# Patient Record
Sex: Male | Born: 1960 | Race: White | Hispanic: No | Marital: Married | Smoking: Never smoker
Health system: Southern US, Community
[De-identification: ages and names within clinical notes are randomized; demographics above are authoritative.]

---

## 1998-08-15 ENCOUNTER — Ambulatory Visit (HOSPITAL_BASED_OUTPATIENT_CLINIC_OR_DEPARTMENT_OTHER): Admission: RE | Admit: 1998-08-15 | Discharge: 1998-08-15 | Payer: Self-pay | Admitting: Orthopedic Surgery

## 1998-08-16 ENCOUNTER — Encounter: Payer: Self-pay | Admitting: Orthopedic Surgery

## 2003-12-12 ENCOUNTER — Ambulatory Visit: Payer: Self-pay | Admitting: Urology

## 2004-02-05 ENCOUNTER — Ambulatory Visit: Payer: Self-pay | Admitting: Urology

## 2004-05-31 ENCOUNTER — Ambulatory Visit: Payer: Self-pay | Admitting: *Deleted

## 2004-06-14 ENCOUNTER — Ambulatory Visit: Payer: Self-pay | Admitting: *Deleted

## 2004-06-21 ENCOUNTER — Ambulatory Visit: Payer: Self-pay | Admitting: *Deleted

## 2004-07-01 ENCOUNTER — Ambulatory Visit: Payer: Self-pay | Admitting: *Deleted

## 2004-07-08 ENCOUNTER — Ambulatory Visit: Payer: Self-pay | Admitting: *Deleted

## 2004-07-19 ENCOUNTER — Ambulatory Visit: Payer: Self-pay | Admitting: *Deleted

## 2004-07-26 ENCOUNTER — Ambulatory Visit: Payer: Self-pay | Admitting: *Deleted

## 2004-08-02 ENCOUNTER — Ambulatory Visit: Payer: Self-pay | Admitting: *Deleted

## 2004-08-09 ENCOUNTER — Ambulatory Visit: Payer: Self-pay | Admitting: *Deleted

## 2004-08-16 ENCOUNTER — Ambulatory Visit: Payer: Self-pay | Admitting: *Deleted

## 2004-08-23 ENCOUNTER — Ambulatory Visit: Payer: Self-pay | Admitting: *Deleted

## 2004-09-06 ENCOUNTER — Ambulatory Visit: Payer: Self-pay | Admitting: *Deleted

## 2016-04-02 ENCOUNTER — Encounter: Payer: Self-pay | Admitting: Student

## 2016-04-02 ENCOUNTER — Other Ambulatory Visit: Payer: Self-pay | Admitting: Student

## 2016-04-02 ENCOUNTER — Ambulatory Visit (INDEPENDENT_AMBULATORY_CARE_PROVIDER_SITE_OTHER): Payer: 59 | Admitting: Student

## 2016-04-02 DIAGNOSIS — M7071 Other bursitis of hip, right hip: Secondary | ICD-10-CM

## 2016-04-02 MED ORDER — MELOXICAM 15 MG PO TABS: 15.0000 mg | ORAL_TABLET | Freq: Every day | ORAL | 2 refills | Status: DC

## 2016-04-02 NOTE — Progress Notes (Signed)
  Johnny Anderson - 56 y.o. male MRN [id number]  Date of birth: 04/15/1960  SUBJECTIVE:  Including CC & ROS.  CC: right hamstring pain  Complains of right hamstring pain that has been ongoing for the past 3 weeks. He reports that over Christmas he has been sitting on a couch that had a metal bar through the middle of it and he feels that it was right underneath issue tuberosities. He is having pain aspect. He denies any acute injury to the area. He does with but it does not bother him to lift. He feels that he has full strength. He denies any radiculopathy.  It only bothers him to sit long periods.    ROS: No unexpected weight loss, fever, chills, swelling, instability, muscle pain, numbness/tingling, redness, otherwise see HPI   PMHx - Updated and reviewed.  Contributory factors include: Negative PSHx - Updated and reviewed.  Contributory factors include:  Negative FHx - Updated and reviewed.  Contributory factors include:  Negative Social Hx - Updated and reviewed. Contributory factors include: Negative Medications - reviewed   DATA REVIEWED: none  PHYSICAL EXAM:  VS: BP:(!) 148/90  HR: bpm  TEMP: ( )  RESP:   HT:6' (182.9 cm)   WT:295 lb (133.8 kg)  BMI:40.1 PHYSICAL EXAM: Gen: NAD, alert, cooperative with exam, well-appearing HEENT: clear conjunctiva,  CV:  no edema, capillary refill brisk, normal rate Resp: non-labored Skin: no rashes, normal turgor  Neuro: no gross deficits.  Psych:  alert and oriented  Hip: ROM IR: 45 Deg, ER: 45 Deg, Flexion: 75 Deg, Extension: 100 Deg, Abduction: 45 Deg, Adduction: 45 Deg Strength IR: 5/5, ER: 5/5, Flexion: 5/5, Extension: 5/5, Abduction: 5/5, Adduction: 5/5 Pelvic alignment unremarkable to inspection and palpation. Standing hip rotation and gait without trendelenburg sign / unsteadiness. Greater trochanter without tenderness to palpation. Ischial tuberosity with mild TTP on right Full hamstring strengthening for all heads of the  hamstring on right No tenderness over piriformis. No pain with FABER or FADIR. No SI joint tenderness and normal minimal SI movement.  Ultrasound: Limited ultrasound of right gluteal region was obtained. Violation of the hamstring tendon were reviewed and long and short axis which hypoechoic or hyperechoic changes. Ischial tuberosity with hypoechoic changes in the bursa. Findings consistent with ischial tuberosity bursitis.  ASSESSMENT & PLAN:   Ischial bursitis of right side Recommend anti-inflammatories, avoidance of prolonged sitting, ice.  Can continue exercises.

## 2016-04-02 NOTE — Assessment & Plan Note (Addendum)
Recommend anti-inflammatories, avoidance of prolonged sitting, ice.  Can continue exercises.  Follow up if not improved in the next 3-4 weeks. Can consider other imaging and/or exercises at that time. He is having minimal pain to doubt a stress injury to the bone. Did not see any tears on ultrasound.

## 2016-06-11 ENCOUNTER — Ambulatory Visit (INDEPENDENT_AMBULATORY_CARE_PROVIDER_SITE_OTHER): Payer: 59 | Admitting: Family Medicine

## 2016-06-11 ENCOUNTER — Encounter: Payer: Self-pay | Admitting: Family Medicine

## 2016-06-11 VITALS — BP 168/92 | Ht 72.0 in | Wt 295.0 lb

## 2016-06-11 DIAGNOSIS — M7071 Other bursitis of hip, right hip: Secondary | ICD-10-CM | POA: Diagnosis not present

## 2016-06-12 NOTE — Progress Notes (Signed)
  [facility] - 56 y.o. male MRN [id number]  Date of birth: 03-07-1960  SUBJECTIVE:  Including CC & ROS.   Mr. Johnny Anderson is a 56 year old male that is following up for right hamstring pain. There is concern that he had some is she will bursitis. He has been doing exercises and taking mobic and that seems improved his pain. He seems to have worse pain with long drives. The pain seems a last about 24 hours then it feels better. He has no pain with working out. He has been doing some nonweightbearing squats and that has improved the pain. There is no specific inciting event prior to this pain occurring.  ROS: No unexpected weight loss, fever, chills, swelling, instability, numbness/tingling, redness, otherwise see HPI    HISTORY: Past Medical, Surgical, Social, and Family History Reviewed & Updated per EMR.   Pertinent Historical Findings include: PMSHx -  Left tibial surgery  PSHx - never smoker    DATA REVIEWED: Previous ultrasound   PHYSICAL EXAM:  VS: BP:(!) 168/92  HR: bpm  TEMP: ( )  RESP:   HT:6' (182.9 cm)   WT:295 lb (133.8 kg)  BMI:40.1 PHYSICAL EXAM: Gen: NAD, alert, cooperative with exam, well-appearing HEENT: clear conjunctiva, EOMI CV:  no edema, capillary refill brisk,  Resp: non-labored, normal speech Skin: no rashes, normal turgor  Neuro: no gross deficits.  Psych:  alert and oriented Right Hip:  No tenderness to palpation over the piriformis, SI joint, lumbar spine, paraspinal muscle, or greater trochanter. No significant tenderness over the issue tuberosity. No tenderness to palpation over the mid belly of the hamstring. Normal hip flexion strength. Normal internal and external rotation of the hip. Normal knee flexion extension. Normal strength resistance with knee flexion and extension. Neurovascularly intact.   ASSESSMENT & PLAN:   Ischial bursitis of right side He seems to have improvement of his problem that is only exacerbated with long  drives. - He can start to wean off of the mobic and take it as needed - He was provided some further home exercises today. - He will follow-up as needed and can consider an injection under ultrasound guidance in the future.

## 2016-06-12 NOTE — Assessment & Plan Note (Signed)
He seems to have improvement of his problem that is only exacerbated with long drives. - He can start to wean off of the mobic and take it as needed - He was provided some further home exercises today. - He will follow-up as needed and can consider an injection under ultrasound guidance in the future.

## 2016-07-02 ENCOUNTER — Other Ambulatory Visit: Payer: Self-pay | Admitting: Student

## 2016-07-30 DIAGNOSIS — I10 Essential (primary) hypertension: Secondary | ICD-10-CM | POA: Diagnosis not present

## 2016-07-30 DIAGNOSIS — Z79899 Other long term (current) drug therapy: Secondary | ICD-10-CM | POA: Diagnosis not present

## 2016-08-11 ENCOUNTER — Ambulatory Visit
Admission: RE | Admit: 2016-08-11 | Discharge: 2016-08-11 | Disposition: A | Discharge status: Home / Self Care | Payer: 59 | Source: Ambulatory Visit | Attending: Sports Medicine | Admitting: Sports Medicine

## 2016-08-11 ENCOUNTER — Ambulatory Visit (INDEPENDENT_AMBULATORY_CARE_PROVIDER_SITE_OTHER): Payer: 59 | Admitting: Sports Medicine

## 2016-08-11 VITALS — BP 132/90 | Ht 72.0 in | Wt 295.0 lb

## 2016-08-11 DIAGNOSIS — M7071 Other bursitis of hip, right hip: Secondary | ICD-10-CM

## 2016-08-11 DIAGNOSIS — M1990 Unspecified osteoarthritis, unspecified site: Secondary | ICD-10-CM | POA: Diagnosis not present

## 2016-08-11 NOTE — Progress Notes (Signed)
   Subjective:    Patient ID: Johnny Anderson, male    DOB: 04/30/1960, 56 y.o.   MRN: [id number]  HPI chief complaint: Posterior right hip pain  Patient comes in today with persistent posterior right hip pain. He has been seen in our clinic previously and diagnosed with possible ischial tuberosity bursitis. He got initial improvement with meloxicam but it was not curative. He continues to have pain that he localizes to the ischium with prolonged sitting. Symptoms improve with standing and walking. No numbness or tingling. No groin pain. Previous ultrasound suggested inflammation in the bursa at the ischial tuberosity. No x-rays have been done.    Review of Systems As above    Objective:   Physical Exam  Well-developed, well-nourished. No acute distress  Right hip: Smooth painless hip range of motion with a negative log roll. He has mild tenderness to palpation at the ischial tuberosity. Excellent strength with resisted hamstring. No pain with this. Negative straight leg raise. Neurovascularly intact distally. Walking without a limp.  X-rays of his pelvis show some mild degenerative changes at the right SI joint and of both hips. No abnormality of the ischium.      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 Patient will return to the office in 2-3 weeks for a diagnostic/therapeutic ultrasound-guided ischial tuberosity bursal injection. In the meantime, I think he can continue with activity as tolerated. If he continues to have pain after the injection then we may need to consider merits of further diagnostic imaging.

## 2016-08-25 ENCOUNTER — Ambulatory Visit: Payer: 59 | Admitting: Sports Medicine

## 2016-09-01 ENCOUNTER — Encounter: Payer: Self-pay | Admitting: Sports Medicine

## 2016-09-01 ENCOUNTER — Ambulatory Visit (INDEPENDENT_AMBULATORY_CARE_PROVIDER_SITE_OTHER): Payer: 59 | Admitting: Sports Medicine

## 2016-09-01 VITALS — BP 127/72 | Ht 72.0 in | Wt 285.0 lb

## 2016-09-01 DIAGNOSIS — M7071 Other bursitis of hip, right hip: Secondary | ICD-10-CM

## 2016-09-01 MED ORDER — METHYLPREDNISOLONE ACETATE 40 MG/ML IJ SUSP
40.0000 mg | Freq: Once | INTRAMUSCULAR | Status: AC
  Administered 2016-09-01: 40 mg via INTRA_ARTICULAR

## 2016-09-01 NOTE — Progress Notes (Signed)
Patient ID: Johnny EasterlyJames W Anderson, male   DOB: 11/16/1960, 56 y.o.   MRN: [id number]   Patient comes in today for an ultrasound guided cortisone injection into his right ischial tuberosity bursa. Please see previous office notes for details regarding history and physical exam findings. 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 No bony abnormalities at the ischial tuberosity.  Under ultrasound guidance, the patient's right ischial tuberosity bursa was injected with cortisone. This was done after risks and benefits were explained including the risk of tendon rupture, infection, bleeding, hematoma formation, and sciatic nerve injury. Patient tolerated this procedure without difficulty. I think he would also benefit from physical therapy with Ellamae SiaJohn O'Halloran. He can discharge to a home exercise program per Dr. Val Eagle Halloran's discretion. Patient will follow-up with me as needed.  Consent obtained and verified. Time-out conducted. Noted no overlying erythema, induration, or other signs of local infection. Skin prepped in a sterile fashion. Topical analgesic spray: Ethyl chloride. Joint: right ischial tuberosity bursa Needle: 25g 1.5 inch Completed without difficulty. Meds: 3cc 1% xylocaine, 1cc (40mg ) depomedrol  Advised to call if fevers/chills, erythema, induration, drainage, or persistent bleeding.

## 2016-11-27 DIAGNOSIS — H26491 Other secondary cataract, right eye: Secondary | ICD-10-CM | POA: Diagnosis not present

## 2016-11-27 DIAGNOSIS — Z961 Presence of intraocular lens: Secondary | ICD-10-CM | POA: Diagnosis not present

## 2016-11-27 DIAGNOSIS — H2512 Age-related nuclear cataract, left eye: Secondary | ICD-10-CM | POA: Diagnosis not present

## 2016-12-03 DIAGNOSIS — H26491 Other secondary cataract, right eye: Secondary | ICD-10-CM | POA: Diagnosis not present

## 2017-03-09 DIAGNOSIS — Z0000 Encounter for general adult medical examination without abnormal findings: Secondary | ICD-10-CM | POA: Diagnosis not present

## 2017-03-09 DIAGNOSIS — Z79899 Other long term (current) drug therapy: Secondary | ICD-10-CM | POA: Diagnosis not present

## 2017-03-09 DIAGNOSIS — Z125 Encounter for screening for malignant neoplasm of prostate: Secondary | ICD-10-CM | POA: Diagnosis not present

## 2017-03-09 DIAGNOSIS — I10 Essential (primary) hypertension: Secondary | ICD-10-CM | POA: Diagnosis not present

## 2017-03-09 DIAGNOSIS — Z23 Encounter for immunization: Secondary | ICD-10-CM | POA: Diagnosis not present

## 2017-04-06 DIAGNOSIS — Z79899 Other long term (current) drug therapy: Secondary | ICD-10-CM | POA: Diagnosis not present

## 2017-06-08 DIAGNOSIS — I10 Essential (primary) hypertension: Secondary | ICD-10-CM | POA: Diagnosis not present

## 2017-09-16 DIAGNOSIS — Z79899 Other long term (current) drug therapy: Secondary | ICD-10-CM | POA: Diagnosis not present

## 2017-09-16 DIAGNOSIS — I10 Essential (primary) hypertension: Secondary | ICD-10-CM | POA: Diagnosis not present

## 2018-03-15 DIAGNOSIS — E7800 Pure hypercholesterolemia, unspecified: Secondary | ICD-10-CM | POA: Diagnosis not present

## 2018-03-15 DIAGNOSIS — Z125 Encounter for screening for malignant neoplasm of prostate: Secondary | ICD-10-CM | POA: Diagnosis not present

## 2018-03-15 DIAGNOSIS — Z23 Encounter for immunization: Secondary | ICD-10-CM | POA: Diagnosis not present

## 2018-03-15 DIAGNOSIS — I10 Essential (primary) hypertension: Secondary | ICD-10-CM | POA: Diagnosis not present

## 2018-03-15 DIAGNOSIS — Z79899 Other long term (current) drug therapy: Secondary | ICD-10-CM | POA: Diagnosis not present

## 2018-03-15 DIAGNOSIS — Z0000 Encounter for general adult medical examination without abnormal findings: Secondary | ICD-10-CM | POA: Diagnosis not present
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
## 2019-05-20 ENCOUNTER — Telehealth: Payer: Self-pay | Admitting: *Deleted

## 2019-05-20 NOTE — Telephone Encounter (Signed)
Pt has now called back to discuss [facility] "failure in this whole process". States he will go to [facility] and we can expect a call from them. Tried to help him set up MyChart so he can see his appt. He states MyChart is a Education officer, museum for [employer]. States his other doctors have portals. I tried to explain it is our portal but he does not want to hear that. He again is being rude, then apologizing, rude, then apologizing. States after speaking with me earlier he went on [facility] site and they send an email to confirm, it states don't come if you don't have an email because you will not get a vaccine. I asked him if he got an appt there so I can cancel his appt I made earlier. He wants to get his through [facility].  I explained that I have now checked three times for him and he has an appt. I also explained that maybe a computerized email is sent but that I myself, this night, due to policy, I can not send him an email to confirm. When I said I sent him a new activation code for MyChart he became more upset. States if I don't have MyChart how would I ever know that. I explained if he will set it up he would have his health information handy in one spot. He again states it is a Education officer, museum. Demanding to know who he can call. I stated that it being Friday night there will be no one to call tonight. I gave him Marcelino Duster Rothrock's name as Interior and spatial designer of [employer]. He asked for a phone number. Gave him the [phone]. Explained that she would not be in office until Monday.

## 2019-05-20 NOTE — Telephone Encounter (Signed)
Error, see two incoming phone calls from this date, disregard this one.

## 2019-05-20 NOTE — Telephone Encounter (Signed)
Pt called very upset about the system for signing up for vaccine. [facility] [place] [facility] and then it asked to go to MyChart which he did and then there was no way to return. Wants email confirmation about the appt. I looked and there was no appt. I made appt for 3/29 at 3pm. Explained that I can not email but will ask about the situation and see what I can do. Reassured him that he does have the appt I scheduled for him. Still upset, ranting about the bad system. Does not want to hear about MyChart. Reassured as best I could.

## 2019-05-21 ENCOUNTER — Ambulatory Visit: Payer: Self-pay | Attending: Internal Medicine

## 2019-05-23 ENCOUNTER — Ambulatory Visit: Payer: 59 | Attending: Internal Medicine

## 2019-05-23 DIAGNOSIS — Z23 Encounter for immunization: Secondary | ICD-10-CM

## 2019-05-23 NOTE — Progress Notes (Signed)
   Covid-19 Vaccination Clinic  Name:  Johnny Anderson    MRN: [id number] DOB: 22-Mar-1960  05/23/2019  [name] was observed post Covid-19 immunization for 15 minutes without incident. He was provided with Vaccine Information Sheet and instruction to access the V-Safe system.   [name] was instructed to call 911 with any severe reactions post vaccine: Marland Kitchen Difficulty breathing  . Swelling of face and throat  . A fast heartbeat  . A bad rash all over body  . Dizziness and weakness   Immunizations Administered    Name Date Dose VIS Date Route   Pfizer COVID-19 Vaccine 05/23/2019  1:42 PM 0.3 mL 02/04/2019 Intramuscular   Manufacturer: [employer], [employer]   Lot: [id number]   NDC: [address]-9

## 2019-06-14 ENCOUNTER — Ambulatory Visit: Payer: 59 | Attending: Internal Medicine

## 2019-06-14 DIAGNOSIS — Z23 Encounter for immunization: Secondary | ICD-10-CM

## 2019-06-14 NOTE — Progress Notes (Signed)
   Covid-19 Vaccination Clinic  Name:  Johnny Anderson    MRN: [id number] DOB: 1960-11-20  06/14/2019  [name] was observed post Covid-19 immunization for 15 minutes without incident. He was provided with Vaccine Information Sheet and instruction to access the V-Safe system.   [name] was instructed to call 911 with any severe reactions post vaccine: Marland Kitchen Difficulty breathing  . Swelling of face and throat  . A fast heartbeat  . A bad rash all over body  . Dizziness and weakness   Immunizations Administered    Name Date Dose VIS Date Route   Pfizer COVID-19 Vaccine 06/14/2019  2:02 PM 0.3 mL 04/20/2018 Intramuscular   Manufacturer: [employer], [employer]   Lot: [id number]   NDC: [address]-1

## 2019-08-09 ENCOUNTER — Other Ambulatory Visit: Payer: Self-pay | Admitting: Orthopedic Surgery

## 2019-08-09 DIAGNOSIS — M545 Low back pain, unspecified: Secondary | ICD-10-CM

## 2019-09-10 ENCOUNTER — Other Ambulatory Visit: Payer: 59

## 2020-03-15 ENCOUNTER — Other Ambulatory Visit: Payer: Self-pay

## 2020-03-15 ENCOUNTER — Ambulatory Visit (INDEPENDENT_AMBULATORY_CARE_PROVIDER_SITE_OTHER): Payer: 59 | Admitting: Allergy and Immunology

## 2020-03-15 ENCOUNTER — Encounter: Payer: Self-pay | Admitting: Allergy and Immunology

## 2020-03-15 VITALS — BP 126/82 | HR 85 | Resp 16 | Ht 72.0 in | Wt 285.6 lb

## 2020-03-15 DIAGNOSIS — J310 Chronic rhinitis: Secondary | ICD-10-CM | POA: Diagnosis not present

## 2020-03-15 DIAGNOSIS — T59891A Toxic effect of other specified gases, fumes and vapors, accidental (unintentional), initial encounter: Secondary | ICD-10-CM

## 2020-03-15 DIAGNOSIS — T50905A Adverse effect of unspecified drugs, medicaments and biological substances, initial encounter: Secondary | ICD-10-CM

## 2020-03-15 MED ORDER — MUPIROCIN 2 % EX OINT: TOPICAL_OINTMENT | CUTANEOUS | 0 refills | Status: DC

## 2020-03-15 NOTE — Patient Instructions (Addendum)
  1. Treat inflammation and irritation of nasal airway:   A. Nasal saline followed by Bactroban in nose 3 times a day for 2 weeks  B. OTC Rhinocort - 1 spray each nostril 1 time a day (No Flonase)  C. Prednisone 10 mg - 1 tablet 1 time per day for 10 days only  2.  Temporarily eliminate use of statin for at least 4 weeks  3. If doing well while having exposure and after treatment, then stay on Rhinocort at 3 times per week and do not use statin for an additional 3 months.  4. If not doing well while having exposure and after treatment, exchange HVAC system  5. Immediately eliminate ozone generator from household
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
## 2020-03-16 ENCOUNTER — Encounter: Payer: Self-pay | Admitting: Allergy and Immunology

## 2020-03-16 ENCOUNTER — Ambulatory Visit: Payer: Self-pay | Admitting: Allergy and Immunology

## 2021-04-19 ENCOUNTER — Other Ambulatory Visit: Payer: Self-pay | Admitting: Physician Assistant

## 2021-04-19 DIAGNOSIS — N50819 Testicular pain, unspecified: Secondary | ICD-10-CM

## 2021-04-23 ENCOUNTER — Ambulatory Visit
Admission: RE | Admit: 2021-04-23 | Discharge: 2021-04-23 | Disposition: A | Discharge status: Home / Self Care | Payer: 59 | Source: Ambulatory Visit | Attending: Physician Assistant | Admitting: Physician Assistant

## 2021-04-23 DIAGNOSIS — N50819 Testicular pain, unspecified: Secondary | ICD-10-CM

## 2021-12-02 ENCOUNTER — Other Ambulatory Visit: Payer: Self-pay | Admitting: Orthopedic Surgery

## 2021-12-02 DIAGNOSIS — Z01818 Encounter for other preprocedural examination: Secondary | ICD-10-CM

## 2021-12-31 ENCOUNTER — Ambulatory Visit
Admission: RE | Admit: 2021-12-31 | Discharge: 2021-12-31 | Disposition: A | Discharge status: Home / Self Care | Payer: 59 | Source: Ambulatory Visit | Attending: Orthopedic Surgery | Admitting: Orthopedic Surgery

## 2021-12-31 DIAGNOSIS — Z01818 Encounter for other preprocedural examination: Secondary | ICD-10-CM

## 2022-05-14 ENCOUNTER — Encounter: Payer: Self-pay | Admitting: Podiatry

## 2022-05-14 ENCOUNTER — Ambulatory Visit: Payer: 59 | Admitting: Podiatry

## 2022-05-14 DIAGNOSIS — Z8739 Personal history of other diseases of the musculoskeletal system and connective tissue: Secondary | ICD-10-CM

## 2022-05-14 DIAGNOSIS — M792 Neuralgia and neuritis, unspecified: Secondary | ICD-10-CM

## 2022-05-14 NOTE — Progress Notes (Signed)
Subjective:  Patient ID: Johnny Anderson, male    DOB: 07-20-1960,  MRN: [id number]  Chief Complaint  Patient presents with   Toe Pain    Left foot 3,4th toe has a burning sensation     62 y.o. male presents with the above complaint.  Patient presents with left third and fourth digit burning sensation.  Patient states that he is noticing this morning more of that he went to get it evaluated he has a significant history injury/arthritis to the knee as well as the Anderson back.  He would like to discuss treatment options if any available.  He wants to make sure that this is nothing concerning.  He has not seen anyone as prior to seeing me denies any other acute complaints.   Review of Systems: Negative except as noted in the HPI. Denies N/V/F/Ch.  History reviewed. No pertinent past medical history.  Current Outpatient Medications:    Acetaminophen Extra Strength 500 MG CAPS, Take 2 capsules by mouth every 8 (eight) hours., Disp: , Rfl:    ALPRAZolam (XANAX) 1 MG tablet, 1 tablet Orally once at night for 30 days, Disp: , Rfl:    aspirin 81 MG chewable tablet, Chew 81 mg by mouth 2 (two) times daily., Disp: , Rfl:    celecoxib (CELEBREX) 100 MG capsule, Take 100 mg by mouth 2 (two) times daily., Disp: , Rfl:    diclofenac (CATAFLAM) 50 MG tablet, Take 50 mg by mouth 2 (two) times daily., Disp: , Rfl:    diclofenac (VOLTAREN) 50 MG EC tablet, , Disp: , Rfl:    gabapentin (NEURONTIN) 100 MG capsule, Take 100 mg by mouth 3 (three) times daily., Disp: , Rfl:    ondansetron (ZOFRAN) 4 MG tablet, Take 4 mg by mouth every 6 (six) hours as needed., Disp: , Rfl:    oxyCODONE (OXY IR/ROXICODONE) 5 MG immediate release tablet, Take 5-10 mg by mouth every 6 (six) hours as needed., Disp: , Rfl:    sertraline (ZOLOFT) 50 MG tablet, , Disp: , Rfl:    amLODipine-benazepril (LOTREL) 10-20 MG capsule, TK 1 C PO QD, Disp: , Rfl: 3   mupirocin ointment (BACTROBAN) 2 %, Apply in nose 3 times a day for 2 weeks,  Disp: 30 g, Rfl: 0   rosuvastatin (CRESTOR) 10 MG tablet, Take 10 mg by mouth at bedtime., Disp: , Rfl:   Social History   Tobacco Use  Smoking Status Never  Smokeless Tobacco Never    No Known Allergies Objective:  There were no vitals filed for this visit. There is no height or weight on file to calculate BMI. Constitutional Well developed. Well nourished.  Vascular Dorsalis pedis pulses palpable bilaterally. Posterior tibial pulses palpable bilaterally. Capillary refill normal to all digits.  No cyanosis or clubbing noted. Pedal hair growth normal.  Neurologic Normal speech. Oriented to person, place, and time. Epicritic sensation to light touch grossly present bilaterally.  Negative tarsal tunnel syndrome negative common peroneal nerve syndrome.  Burning tingling noted to distal tip of third and fourth digit.  Negative superficial peroneal nerve syndrome  Dermatologic Nails within normal limits Skin within normal limits  Orthopedic: Normal joint ROM without pain or crepitus bilaterally. No visible deformities. No bony tenderness.   Radiographs: None Assessment:   1. Neuritis   2. History of low back pain    Plan:  Patient was evaluated and treated and all questions answered.  Left third digit and fourth digit neuritis with history of herniated disc  in the Anderson back -All questions and concerns were discussed with the patient and ask extensive detail -I discussed with patient that this could be beginning signs of neuritis from a pinched nerve in the Anderson back.  It does not appear to be any single name nerve affecting it seems to be spreading from 4 digit to third digit.  I discussed with patient extensive detail he states understanding -I encouraged him to follow-up with a back specialist.  He states understanding if any foot and ankle issues on future he will come back and see me.  No follow-ups on file.   Left third toe and fourth toe burning low back history of  hernitated disc see spine doc

## 2022-11-12 ENCOUNTER — Encounter: Payer: Self-pay | Admitting: Podiatry

## 2022-11-12 ENCOUNTER — Ambulatory Visit (INDEPENDENT_AMBULATORY_CARE_PROVIDER_SITE_OTHER): Payer: 59 | Admitting: Podiatry

## 2022-11-12 DIAGNOSIS — M7752 Other enthesopathy of left foot: Secondary | ICD-10-CM

## 2022-11-12 NOTE — Progress Notes (Signed)
Subjective:  Patient ID: Johnny Anderson, male    DOB: 07/31/1960,  MRN: [id number]  Chief Complaint  Patient presents with   Toe Pain    Pt stated that he still has some pain    62 y.o. male presents with the above complaint.  Patient presents with new complaint of left second and third toe capsulitis painful to touch is progressive and worse worse with ambulation he has not seen anyone as prior to seeing me or shoe pressure pain scale 7 out of 10 dull aching nature he would like to discuss steroid injection   Review of Systems: Negative except as noted in the HPI. Denies N/V/F/Ch.  History reviewed. No pertinent past medical history.  Current Outpatient Medications:    methocarbamol (ROBAXIN) 500 MG tablet, Take 500 mg by mouth every 6 (six) hours as needed., Disp: , Rfl:    ondansetron (ZOFRAN-ODT) 4 MG disintegrating tablet, Take 4 mg by mouth every 8 (eight) hours as needed., Disp: , Rfl:    Acetaminophen Extra Strength 500 MG CAPS, Take 2 capsules by mouth every 8 (eight) hours., Disp: , Rfl:    ALPRAZolam (XANAX) 1 MG tablet, 1 tablet Orally once at night for 30 days, Disp: , Rfl:    amLODipine-benazepril (LOTREL) 10-20 MG capsule, TK 1 C PO QD, Disp: , Rfl: 3   aspirin 81 MG chewable tablet, Chew 81 mg by mouth 2 (two) times daily., Disp: , Rfl:    celecoxib (CELEBREX) 100 MG capsule, Take 100 mg by mouth 2 (two) times daily., Disp: , Rfl:    diclofenac (CATAFLAM) 50 MG tablet, Take 50 mg by mouth 2 (two) times daily., Disp: , Rfl:    diclofenac (VOLTAREN) 50 MG EC tablet, , Disp: , Rfl:    gabapentin (NEURONTIN) 100 MG capsule, Take 100 mg by mouth 3 (three) times daily., Disp: , Rfl:    mupirocin ointment (BACTROBAN) 2 %, Apply in nose 3 times a day for 2 weeks, Disp: 30 g, Rfl: 0   ondansetron (ZOFRAN) 4 MG tablet, Take 4 mg by mouth every 6 (six) hours as needed., Disp: , Rfl:    oxyCODONE (OXY IR/ROXICODONE) 5 MG immediate release tablet, Take 5-10 mg by mouth every 6  (six) hours as needed., Disp: , Rfl:    rosuvastatin (CRESTOR) 10 MG tablet, Take 10 mg by mouth at bedtime., Disp: , Rfl:    sertraline (ZOLOFT) 50 MG tablet, , Disp: , Rfl:   Social History   Tobacco Use  Smoking Status Never  Smokeless Tobacco Never    No Known Allergies Objective:  There were no vitals filed for this visit. There is no height or weight on file to calculate BMI. Constitutional Well developed. Well nourished.  Vascular Dorsalis pedis pulses palpable bilaterally. Posterior tibial pulses palpable bilaterally. Capillary refill normal to all digits.  No cyanosis or clubbing noted. Pedal hair growth normal.  Neurologic Normal speech. Oriented to person, place, and time. Epicritic sensation to light touch grossly present bilaterally.  Dermatologic Nails well groomed and normal in appearance. No open wounds. No skin lesions.  Orthopedic: Pain on palpation left second and third PIPJ joint pain with range of motion of the joint no deep intra-articular pain noted.  Mild redness noted.  Denies any history of gout.   Radiographs: None Assessment:   1. Capsulitis of toe of left foot    Plan:  Patient was evaluated and treated and all questions answered.  Left second and third PIPJ capsulitis -  All questions and concerns were discussed with the patient in extensive detail given the amount of pain that he is having he will benefit from steroid injection of decrease inflammatory component associate with pain.  Patient agrees with plan to proceed with steroid injection -I discussed shoe gear modification in extensive detail -A steroid injection was performed at left second and third PIPJ using 1% plain Lidocaine and 10 mg of Kenalog. This was well tolerated.   No follow-ups on file.   Left second PIPJ caspultiis  Left third PIPK capsulitis

## 2022-12-02 ENCOUNTER — Ambulatory Visit (INDEPENDENT_AMBULATORY_CARE_PROVIDER_SITE_OTHER): Payer: 59 | Admitting: Allergy and Immunology

## 2022-12-02 ENCOUNTER — Other Ambulatory Visit: Payer: Self-pay

## 2022-12-02 ENCOUNTER — Encounter: Payer: Self-pay | Admitting: Allergy and Immunology

## 2022-12-02 VITALS — BP 132/84 | HR 74 | Temp 98.1°F | Resp 18 | Ht 70.47 in | Wt 282.5 lb

## 2022-12-02 DIAGNOSIS — J310 Chronic rhinitis: Secondary | ICD-10-CM

## 2022-12-02 MED ORDER — MUPIROCIN 2 % EX OINT: TOPICAL_OINTMENT | CUTANEOUS | 0 refills | Status: AC

## 2022-12-02 MED ORDER — BUDESONIDE 32 MCG/ACT NA SUSP: 1.0000 | Freq: Every day | NASAL | 5 refills | Status: AC

## 2022-12-02 NOTE — Patient Instructions (Addendum)
   1. Treat inflammation and irritation of nasal airway:  A. Nasal saline followed by Bactroban in nose 3 times a day for 2 weeks          B. OTC Rhinocort - 1 spray each nostril 1 time a day   C. Prednisone 10 mg - 1 tablet 1 time per day for 7 days only  2. When doing well, continue nasal saline a few times per day and use Rhinocort - 1 spray each nostril 3 times per week  3. Further treatment ??? Yes, if persistent.

## 2022-12-02 NOTE — Progress Notes (Unsigned)
[place] - [place] - [place] - [place] - [place]   Follow-up Note  Referring Provider: No ref. provider found Primary Provider: Merlene Laughter, MD (Inactive) Date of Office Visit: 12/02/2022  Subjective:   Johnny Anderson (DOB: 08-08-1960) is a 62 y.o. male who [facility] in re-evaluation of the following:  HPI: Johnny Anderson presents to this clinic in evaluation of nasal irritation.  I last saw him in this clinic 15 March 2020 for his initial evaluation.  He appeared to have very significant nasal irritation during his last evaluation and we treated him with a combination of topical Bactroban and Rhinocort and some systemic steroids and he did much better at that point in time and resolve this issue for many years.  However, over the course of the past month or so he has redeveloped the sensation of burning nose and nasal bridge and sometimes affecting his eyes especially his lower lids and he has had some hyperacute smelling issues.  He restarted his saline and Bactroban and Rhinocort for about 10 days and this definitely did help but he discontinued this treatment and he still has some baseline irritation.  Interestingly, he is much better when he went on a trip to [place] but it reoccurred once he arrived back at home.  He did have a ceiling fan installed in his bedroom approximately 2 months ago.  He has had exposure to freshly painted walls with latex paint over the course of the past month.  Allergies as of 12/02/2022   No Known Allergies      Medication List    Acetaminophen Extra Strength 500 MG Caps Take 2 capsules by mouth every 8 (eight) hours.   ALPRAZolam 1 MG tablet Commonly known as: XANAX 1 tablet Orally once at night for 30 days   amLODipine-benazepril 10-20 MG capsule Commonly known as: LOTREL TK 1 C PO QD   aspirin 81 MG chewable tablet Chew 81 mg by mouth 2 (two) times daily.   celecoxib 100 MG  capsule Commonly known as: CELEBREX Take 100 mg by mouth 2 (two) times daily.   diclofenac 50 MG EC tablet Commonly known as: VOLTAREN   diclofenac 50 MG tablet Commonly known as: CATAFLAM Take 50 mg by mouth 2 (two) times daily.   gabapentin 100 MG capsule Commonly known as: NEURONTIN Take 100 mg by mouth 3 (three) times daily.   methocarbamol 500 MG tablet Commonly known as: ROBAXIN Take 500 mg by mouth every 6 (six) hours as needed.   mupirocin ointment 2 % Commonly known as: BACTROBAN Apply in nose 3 times a day for 2 weeks   ondansetron 4 MG disintegrating tablet Commonly known as: ZOFRAN-ODT Take 4 mg by mouth every 8 (eight) hours as needed.   ondansetron 4 MG tablet Commonly known as: ZOFRAN Take 4 mg by mouth every 6 (six) hours as needed.   oxyCODONE 5 MG immediate release tablet Commonly known as: Oxy IR/ROXICODONE Take 5-10 mg by mouth every 6 (six) hours as needed.   rosuvastatin 10 MG tablet Commonly known as: CRESTOR Take 10 mg by mouth at bedtime.   sertraline 50 MG tablet Commonly known as: ZOLOFT        History reviewed. No pertinent past medical history.  History reviewed. No pertinent surgical history.  Review of systems negative except as noted in HPI / PMHx or noted below:  Review of Systems  Constitutional: Negative.   HENT: Negative.  Eyes: Negative.   Respiratory: Negative.    Cardiovascular: Negative.   Gastrointestinal: Negative.   Genitourinary: Negative.   Musculoskeletal: Negative.   Skin: Negative.   Neurological: Negative.   Endo/Heme/Allergies: Negative.   Psychiatric/Behavioral: Negative.       Objective:   Vitals:   12/02/22 1335  BP: 132/84  Pulse: 74  Resp: 18  Temp: 98.1 F (36.7 C)  SpO2: 96%   Height: 5' 10.47" (179 cm)  Weight: 282 lb 8 oz (128.1 kg)   Physical Exam Constitutional:      Appearance: He is not diaphoretic.  HENT:     Head: Normocephalic.     Right Ear: Tympanic membrane,  ear canal and external ear normal.     Left Ear: Tympanic membrane, ear canal and external ear normal.     Nose: Nose normal. No mucosal edema or rhinorrhea.     Mouth/Throat:     Pharynx: Uvula midline. No oropharyngeal exudate.  Eyes:     Conjunctiva/sclera: Conjunctivae normal.  Neck:     Thyroid: No thyromegaly.     Trachea: Trachea normal. No tracheal tenderness or tracheal deviation.  Cardiovascular:     Rate and Rhythm: Normal rate and regular rhythm.     Heart sounds: Normal heart sounds, S1 normal and S2 normal. No murmur heard. Pulmonary:     Breath sounds: Normal breath sounds.  Lymphadenopathy:     Head:     Right side of head: No tonsillar adenopathy.     Left side of head: No tonsillar adenopathy.     Cervical: No cervical adenopathy.  Skin:    Findings: No erythema or rash.     Nails: There is no clubbing.  Neurological:     Mental Status: He is alert.     Diagnostics: none  Assessment and Plan:   1. Chronic rhinitis     1. Treat inflammation and irritation of nasal airway:  A. Nasal saline followed by Bactroban in nose 3 times a day for 2 weeks             B. OTC Rhinocort - 1 spray each nostril 1 time a day   C. Prednisone 10 mg - 1 tablet 1 time per day for 7 days only  2. When doing well, continue nasal saline a few times per day and use Rhinocort - 1 spray each nostril 3 times per week  3. Further treatment ??? Yes, if persistent.   Johnny Anderson has once again developed some irritation of his nasal airway and to some degree his eyes but it is his nose that is bothering him the most at this point and we are going to treat him with reinstitution of his nasal saline/Bactroban/Rhinocort and give him a very short course of systemic steroids.  And assuming he will do better then we will get him on a regime of using nasal saline on a pretty consistent basis and a little bit of Rhinocort as he moves forward.  Certainly he is going to require further evaluation and  treatment should he develop problems in the face of this treatment.  He will keep in contact with [place] noting his response to this approach.  Laurette Schimke, MD Allergy / Immunology [facility]

## 2022-12-03 ENCOUNTER — Encounter: Payer: Self-pay | Admitting: Allergy and Immunology

## 2022-12-17 ENCOUNTER — Ambulatory Visit (INDEPENDENT_AMBULATORY_CARE_PROVIDER_SITE_OTHER): Payer: 59 | Admitting: Podiatry

## 2022-12-17 ENCOUNTER — Encounter: Payer: Self-pay | Admitting: Podiatry

## 2022-12-17 DIAGNOSIS — M7752 Other enthesopathy of left foot: Secondary | ICD-10-CM

## 2022-12-17 NOTE — Progress Notes (Signed)
Subjective:  Patient ID: Johnny Anderson, male    DOB: 31-Jul-1960,  MRN: [id number]  Chief Complaint  Patient presents with   Routine Post Op    PATIENT STATES DOCTOR HAS DONE A GOOD JOB ON HIS TOE AND IS HERE FOR FOLLOW UP , NO PAIN PATIENT STATES .    62 y.o. male presents with the above complaint.  Patient presents for follow-up of left second and third digit toe capsulitis.  He states is doing better.  He would like to discuss any other treatment options.  He does not have any further pain injection helped   Review of Systems: Negative except as noted in the HPI. Denies N/V/F/Ch.  History reviewed. No pertinent past medical history.  Current Outpatient Medications:    amLODipine-benazepril (LOTREL) 10-20 MG capsule, TK 1 C PO QD, Disp: , Rfl: 3   budesonide (RHINOCORT AQUA) 32 MCG/ACT nasal spray, Place 1 spray into both nostrils daily., Disp: 8.43 mL, Rfl: 5   diclofenac (CATAFLAM) 50 MG tablet, Take 50 mg by mouth 2 (two) times daily., Disp: , Rfl:    diclofenac (VOLTAREN) 50 MG EC tablet, , Disp: , Rfl:    rosuvastatin (CRESTOR) 10 MG tablet, Take 10 mg by mouth at bedtime., Disp: , Rfl:    Acetaminophen Extra Strength 500 MG CAPS, Take 2 capsules by mouth every 8 (eight) hours. (Patient not taking: Reported on 12/17/2022), Disp: , Rfl:    ALPRAZolam (XANAX) 1 MG tablet, 1 tablet Orally once at night for 30 days (Patient not taking: Reported on 12/17/2022), Disp: , Rfl:    aspirin 81 MG chewable tablet, Chew 81 mg by mouth 2 (two) times daily. (Patient not taking: Reported on 12/17/2022), Disp: , Rfl:    celecoxib (CELEBREX) 100 MG capsule, Take 100 mg by mouth 2 (two) times daily. (Patient not taking: Reported on 12/17/2022), Disp: , Rfl:    gabapentin (NEURONTIN) 100 MG capsule, Take 100 mg by mouth 3 (three) times daily. (Patient not taking: Reported on 12/17/2022), Disp: , Rfl:    methocarbamol (ROBAXIN) 500 MG tablet, Take 500 mg by mouth every 6 (six) hours as needed.  (Patient not taking: Reported on 12/17/2022), Disp: , Rfl:    mupirocin ointment (BACTROBAN) 2 %, Apply in nose 3 times a day for 2 weeks then STOP (Patient not taking: Reported on 12/17/2022), Disp: 30 g, Rfl: 0   ondansetron (ZOFRAN) 4 MG tablet, Take 4 mg by mouth every 6 (six) hours as needed. (Patient not taking: Reported on 12/17/2022), Disp: , Rfl:    ondansetron (ZOFRAN-ODT) 4 MG disintegrating tablet, Take 4 mg by mouth every 8 (eight) hours as needed. (Patient not taking: Reported on 12/17/2022), Disp: , Rfl:    oxyCODONE (OXY IR/ROXICODONE) 5 MG immediate release tablet, Take 5-10 mg by mouth every 6 (six) hours as needed. (Patient not taking: Reported on 12/17/2022), Disp: , Rfl:    sertraline (ZOLOFT) 50 MG tablet, , Disp: , Rfl:   Social History   Tobacco Use  Smoking Status Never  Smokeless Tobacco Never    No Known Allergies Objective:  There were no vitals filed for this visit. There is no height or weight on file to calculate BMI. Constitutional Well developed. Well nourished.  Vascular Dorsalis pedis pulses palpable bilaterally. Posterior tibial pulses palpable bilaterally. Capillary refill normal to all digits.  No cyanosis or clubbing noted. Pedal hair growth normal.  Neurologic Normal speech. Oriented to person, place, and time. Epicritic sensation to light touch grossly  present bilaterally.  Dermatologic Nails well groomed and normal in appearance. No open wounds. No skin lesions.  Orthopedic: No further pain on palpation left second and third PIPJ joint no further pain with range of motion of the joint no deep intra-articular pain noted.  Mild redness noted.  Denies any history of gout.   Radiographs: None Assessment:   No diagnosis found.  Plan:  Patient was evaluated and treated and all questions answered.  Left second and third PIPJ capsulitis -All questions and concerns were discussed with the patient in extensive detail clinically healed and  officially discharged from my care.  Discussed shoe gear modification if any foot and ankle issues arise future he will come back and see me.
